# Patient Record
Sex: Female | Born: 1945
Health system: Southern US, Community
[De-identification: ages and names within clinical notes are randomized; demographics above are authoritative.]

---

## 1998-02-17 ENCOUNTER — Ambulatory Visit (HOSPITAL_COMMUNITY): Admission: RE | Admit: 1998-02-17 | Discharge: 1998-02-17 | Payer: Self-pay | Admitting: Obstetrics and Gynecology

## 1999-12-10 ENCOUNTER — Encounter: Payer: Self-pay | Admitting: Emergency Medicine

## 1999-12-10 ENCOUNTER — Emergency Department (HOSPITAL_COMMUNITY): Admission: EM | Admit: 1999-12-10 | Discharge: 1999-12-10 | Payer: Self-pay | Admitting: Emergency Medicine

## 2002-11-28 ENCOUNTER — Emergency Department (HOSPITAL_COMMUNITY): Admission: EM | Admit: 2002-11-28 | Discharge: 2002-11-28 | Payer: Self-pay | Admitting: Emergency Medicine

## 2002-11-28 ENCOUNTER — Encounter: Payer: Self-pay | Admitting: Emergency Medicine

## 2006-06-14 ENCOUNTER — Ambulatory Visit (HOSPITAL_COMMUNITY): Admission: RE | Admit: 2006-06-14 | Discharge: 2006-06-14 | Payer: Self-pay | Admitting: Obstetrics & Gynecology

## 2007-06-19 ENCOUNTER — Ambulatory Visit (HOSPITAL_COMMUNITY): Admission: RE | Admit: 2007-06-19 | Discharge: 2007-06-19 | Payer: Self-pay | Admitting: Obstetrics & Gynecology

## 2008-06-19 ENCOUNTER — Ambulatory Visit (HOSPITAL_COMMUNITY): Admission: RE | Admit: 2008-06-19 | Discharge: 2008-06-19 | Payer: Self-pay | Admitting: Obstetrics & Gynecology

## 2009-06-03 ENCOUNTER — Encounter: Admission: RE | Admit: 2009-06-03 | Discharge: 2009-06-03 | Payer: Self-pay | Admitting: Family Medicine

## 2009-06-25 ENCOUNTER — Ambulatory Visit (HOSPITAL_COMMUNITY): Admission: RE | Admit: 2009-06-25 | Discharge: 2009-06-25 | Payer: Self-pay | Admitting: Obstetrics & Gynecology

## 2010-06-26 ENCOUNTER — Ambulatory Visit (HOSPITAL_COMMUNITY): Admission: RE | Admit: 2010-06-26 | Discharge: 2010-06-26 | Payer: Self-pay | Admitting: Obstetrics & Gynecology

## 2010-12-18 ENCOUNTER — Other Ambulatory Visit (HOSPITAL_COMMUNITY): Payer: Self-pay | Admitting: Obstetrics & Gynecology

## 2010-12-18 DIAGNOSIS — M8580 Other specified disorders of bone density and structure, unspecified site: Secondary | ICD-10-CM

## 2010-12-28 ENCOUNTER — Ambulatory Visit (HOSPITAL_COMMUNITY): Payer: Medicare Other

## 2011-02-03 ENCOUNTER — Ambulatory Visit (HOSPITAL_COMMUNITY): Admission: RE | Admit: 2011-02-03 | Payer: Medicare Other | Source: Ambulatory Visit

## 2011-02-03 ENCOUNTER — Ambulatory Visit (HOSPITAL_COMMUNITY)
Admission: RE | Admit: 2011-02-03 | Discharge: 2011-02-03 | Disposition: A | Discharge status: Home / Self Care | Payer: Medicare Other | Source: Ambulatory Visit | Attending: Obstetrics & Gynecology | Admitting: Obstetrics & Gynecology

## 2011-02-03 DIAGNOSIS — Z780 Asymptomatic menopausal state: Secondary | ICD-10-CM | POA: Insufficient documentation

## 2011-02-03 DIAGNOSIS — Z13820 Encounter for screening for osteoporosis: Secondary | ICD-10-CM | POA: Insufficient documentation

## 2011-02-03 DIAGNOSIS — M8580 Other specified disorders of bone density and structure, unspecified site: Secondary | ICD-10-CM

## 2011-04-06 ENCOUNTER — Other Ambulatory Visit (HOSPITAL_COMMUNITY): Payer: Self-pay | Admitting: Obstetrics & Gynecology

## 2011-04-06 DIAGNOSIS — Z1231 Encounter for screening mammogram for malignant neoplasm of breast: Secondary | ICD-10-CM

## 2011-07-27 ENCOUNTER — Ambulatory Visit (HOSPITAL_COMMUNITY)
Admission: RE | Admit: 2011-07-27 | Discharge: 2011-07-27 | Disposition: A | Discharge status: Home / Self Care | Payer: Medicare Other | Source: Ambulatory Visit | Attending: Obstetrics & Gynecology | Admitting: Obstetrics & Gynecology

## 2011-07-27 DIAGNOSIS — Z1231 Encounter for screening mammogram for malignant neoplasm of breast: Secondary | ICD-10-CM | POA: Insufficient documentation

## 2012-05-09 ENCOUNTER — Other Ambulatory Visit (HOSPITAL_COMMUNITY): Payer: Self-pay | Admitting: Internal Medicine

## 2012-05-09 DIAGNOSIS — Z1231 Encounter for screening mammogram for malignant neoplasm of breast: Secondary | ICD-10-CM

## 2012-07-27 ENCOUNTER — Ambulatory Visit (HOSPITAL_COMMUNITY)
Admission: RE | Admit: 2012-07-27 | Discharge: 2012-07-27 | Disposition: A | Discharge status: Home / Self Care | Payer: Medicare Other | Source: Ambulatory Visit | Attending: Internal Medicine | Admitting: Internal Medicine

## 2012-07-27 DIAGNOSIS — Z1231 Encounter for screening mammogram for malignant neoplasm of breast: Secondary | ICD-10-CM

## 2013-06-07 ENCOUNTER — Other Ambulatory Visit (HOSPITAL_COMMUNITY): Payer: Self-pay | Admitting: Internal Medicine

## 2013-06-07 DIAGNOSIS — Z1231 Encounter for screening mammogram for malignant neoplasm of breast: Secondary | ICD-10-CM

## 2013-06-27 ENCOUNTER — Ambulatory Visit (HOSPITAL_COMMUNITY): Payer: Medicare Other

## 2013-07-30 ENCOUNTER — Ambulatory Visit (HOSPITAL_COMMUNITY)
Admission: RE | Admit: 2013-07-30 | Discharge: 2013-07-30 | Disposition: A | Discharge status: Home / Self Care | Payer: Medicare Other | Source: Ambulatory Visit | Attending: Internal Medicine | Admitting: Internal Medicine

## 2013-07-30 ENCOUNTER — Other Ambulatory Visit (HOSPITAL_COMMUNITY): Payer: Self-pay | Admitting: Internal Medicine

## 2013-07-30 DIAGNOSIS — Z1231 Encounter for screening mammogram for malignant neoplasm of breast: Secondary | ICD-10-CM

## 2014-07-12 ENCOUNTER — Other Ambulatory Visit (HOSPITAL_COMMUNITY): Payer: Self-pay | Admitting: Internal Medicine

## 2014-07-12 DIAGNOSIS — Z1231 Encounter for screening mammogram for malignant neoplasm of breast: Secondary | ICD-10-CM

## 2014-08-02 ENCOUNTER — Ambulatory Visit (HOSPITAL_COMMUNITY)
Admission: RE | Admit: 2014-08-02 | Discharge: 2014-08-02 | Disposition: A | Discharge status: Home / Self Care | Payer: Medicare Other | Source: Ambulatory Visit | Attending: Internal Medicine | Admitting: Internal Medicine

## 2014-08-02 DIAGNOSIS — Z1231 Encounter for screening mammogram for malignant neoplasm of breast: Secondary | ICD-10-CM | POA: Diagnosis present

## 2014-11-13 DIAGNOSIS — E782 Mixed hyperlipidemia: Secondary | ICD-10-CM | POA: Diagnosis not present

## 2014-11-13 DIAGNOSIS — R319 Hematuria, unspecified: Secondary | ICD-10-CM | POA: Diagnosis not present

## 2014-11-20 DIAGNOSIS — R319 Hematuria, unspecified: Secondary | ICD-10-CM | POA: Diagnosis not present

## 2014-11-20 DIAGNOSIS — E782 Mixed hyperlipidemia: Secondary | ICD-10-CM | POA: Diagnosis not present

## 2014-11-21 ENCOUNTER — Other Ambulatory Visit: Payer: Self-pay | Admitting: Internal Medicine

## 2014-11-21 DIAGNOSIS — R319 Hematuria, unspecified: Secondary | ICD-10-CM

## 2014-12-04 ENCOUNTER — Other Ambulatory Visit: Payer: Medicare Other

## 2014-12-04 ENCOUNTER — Ambulatory Visit
Admission: RE | Admit: 2014-12-04 | Discharge: 2014-12-04 | Disposition: A | Discharge status: Home / Self Care | Payer: Medicare Other | Source: Ambulatory Visit | Attending: Internal Medicine | Admitting: Internal Medicine

## 2014-12-04 DIAGNOSIS — N200 Calculus of kidney: Secondary | ICD-10-CM | POA: Diagnosis not present

## 2014-12-04 DIAGNOSIS — R319 Hematuria, unspecified: Secondary | ICD-10-CM

## 2014-12-04 DIAGNOSIS — N281 Cyst of kidney, acquired: Secondary | ICD-10-CM | POA: Diagnosis not present

## 2014-12-04 MED ORDER — IOHEXOL 300 MG/ML  SOLN
125.0000 mL | Freq: Once | INTRAMUSCULAR | Status: AC | PRN
  Administered 2014-12-04: 125 mL via INTRAVENOUS

## 2014-12-06 DIAGNOSIS — R319 Hematuria, unspecified: Secondary | ICD-10-CM | POA: Diagnosis not present

## 2014-12-06 DIAGNOSIS — E782 Mixed hyperlipidemia: Secondary | ICD-10-CM | POA: Diagnosis not present

## 2015-06-06 DIAGNOSIS — E782 Mixed hyperlipidemia: Secondary | ICD-10-CM | POA: Diagnosis not present

## 2015-06-10 DIAGNOSIS — Z780 Asymptomatic menopausal state: Secondary | ICD-10-CM | POA: Diagnosis not present

## 2015-06-10 DIAGNOSIS — M150 Primary generalized (osteo)arthritis: Secondary | ICD-10-CM | POA: Diagnosis not present

## 2015-06-13 DIAGNOSIS — M150 Primary generalized (osteo)arthritis: Secondary | ICD-10-CM | POA: Diagnosis not present

## 2015-06-13 DIAGNOSIS — Z23 Encounter for immunization: Secondary | ICD-10-CM | POA: Diagnosis not present

## 2015-06-13 DIAGNOSIS — E782 Mixed hyperlipidemia: Secondary | ICD-10-CM | POA: Diagnosis not present

## 2015-06-13 DIAGNOSIS — Z0000 Encounter for general adult medical examination without abnormal findings: Secondary | ICD-10-CM | POA: Diagnosis not present

## 2015-07-08 ENCOUNTER — Other Ambulatory Visit (HOSPITAL_COMMUNITY): Payer: Self-pay | Admitting: Internal Medicine

## 2015-07-08 DIAGNOSIS — Z1231 Encounter for screening mammogram for malignant neoplasm of breast: Secondary | ICD-10-CM

## 2015-08-05 ENCOUNTER — Ambulatory Visit
Admission: RE | Admit: 2015-08-05 | Discharge: 2015-08-05 | Disposition: A | Discharge status: Home / Self Care | Payer: Medicare Other | Source: Ambulatory Visit | Attending: Internal Medicine | Admitting: Internal Medicine

## 2015-08-05 DIAGNOSIS — Z1231 Encounter for screening mammogram for malignant neoplasm of breast: Secondary | ICD-10-CM

## 2015-09-15 DIAGNOSIS — R5383 Other fatigue: Secondary | ICD-10-CM | POA: Diagnosis not present

## 2015-12-11 DIAGNOSIS — E782 Mixed hyperlipidemia: Secondary | ICD-10-CM | POA: Diagnosis not present

## 2015-12-11 DIAGNOSIS — M150 Primary generalized (osteo)arthritis: Secondary | ICD-10-CM | POA: Diagnosis not present

## 2016-03-17 DIAGNOSIS — K219 Gastro-esophageal reflux disease without esophagitis: Secondary | ICD-10-CM | POA: Diagnosis not present

## 2016-06-23 DIAGNOSIS — Z0000 Encounter for general adult medical examination without abnormal findings: Secondary | ICD-10-CM | POA: Diagnosis not present

## 2016-06-23 DIAGNOSIS — E782 Mixed hyperlipidemia: Secondary | ICD-10-CM | POA: Diagnosis not present

## 2016-06-23 DIAGNOSIS — Z79899 Other long term (current) drug therapy: Secondary | ICD-10-CM | POA: Diagnosis not present

## 2016-06-23 DIAGNOSIS — R319 Hematuria, unspecified: Secondary | ICD-10-CM | POA: Diagnosis not present

## 2016-06-24 ENCOUNTER — Other Ambulatory Visit: Payer: Self-pay | Admitting: Internal Medicine

## 2016-06-24 DIAGNOSIS — Z1231 Encounter for screening mammogram for malignant neoplasm of breast: Secondary | ICD-10-CM

## 2016-06-30 DIAGNOSIS — Z780 Asymptomatic menopausal state: Secondary | ICD-10-CM | POA: Diagnosis not present

## 2016-06-30 DIAGNOSIS — Z23 Encounter for immunization: Secondary | ICD-10-CM | POA: Diagnosis not present

## 2016-06-30 DIAGNOSIS — M8580 Other specified disorders of bone density and structure, unspecified site: Secondary | ICD-10-CM | POA: Diagnosis not present

## 2016-06-30 DIAGNOSIS — R319 Hematuria, unspecified: Secondary | ICD-10-CM | POA: Diagnosis not present

## 2016-06-30 DIAGNOSIS — M150 Primary generalized (osteo)arthritis: Secondary | ICD-10-CM | POA: Diagnosis not present

## 2016-06-30 DIAGNOSIS — E782 Mixed hyperlipidemia: Secondary | ICD-10-CM | POA: Diagnosis not present

## 2016-07-01 DIAGNOSIS — M5136 Other intervertebral disc degeneration, lumbar region: Secondary | ICD-10-CM | POA: Diagnosis not present

## 2016-07-01 DIAGNOSIS — M5442 Lumbago with sciatica, left side: Secondary | ICD-10-CM | POA: Diagnosis not present

## 2016-08-05 ENCOUNTER — Ambulatory Visit
Admission: RE | Admit: 2016-08-05 | Discharge: 2016-08-05 | Disposition: A | Discharge status: Home / Self Care | Payer: Medicare Other | Source: Ambulatory Visit | Attending: Internal Medicine | Admitting: Internal Medicine

## 2016-08-05 DIAGNOSIS — Z1231 Encounter for screening mammogram for malignant neoplasm of breast: Secondary | ICD-10-CM

## 2016-12-30 DIAGNOSIS — K640 First degree hemorrhoids: Secondary | ICD-10-CM | POA: Diagnosis not present

## 2016-12-30 DIAGNOSIS — Z1211 Encounter for screening for malignant neoplasm of colon: Secondary | ICD-10-CM | POA: Diagnosis not present

## 2017-01-10 DIAGNOSIS — Z1212 Encounter for screening for malignant neoplasm of rectum: Secondary | ICD-10-CM | POA: Diagnosis not present

## 2017-01-10 DIAGNOSIS — Z1211 Encounter for screening for malignant neoplasm of colon: Secondary | ICD-10-CM | POA: Diagnosis not present

## 2017-03-24 DIAGNOSIS — B029 Zoster without complications: Secondary | ICD-10-CM | POA: Diagnosis not present

## 2017-05-26 DIAGNOSIS — S139XXA Sprain of joints and ligaments of unspecified parts of neck, initial encounter: Secondary | ICD-10-CM | POA: Diagnosis not present

## 2017-06-28 ENCOUNTER — Other Ambulatory Visit: Payer: Self-pay | Admitting: Internal Medicine

## 2017-06-28 DIAGNOSIS — Z1231 Encounter for screening mammogram for malignant neoplasm of breast: Secondary | ICD-10-CM

## 2017-07-06 DIAGNOSIS — Z0000 Encounter for general adult medical examination without abnormal findings: Secondary | ICD-10-CM | POA: Diagnosis not present

## 2017-07-06 DIAGNOSIS — E782 Mixed hyperlipidemia: Secondary | ICD-10-CM | POA: Diagnosis not present

## 2017-07-13 DIAGNOSIS — R002 Palpitations: Secondary | ICD-10-CM | POA: Diagnosis not present

## 2017-07-13 DIAGNOSIS — E782 Mixed hyperlipidemia: Secondary | ICD-10-CM | POA: Diagnosis not present

## 2017-07-13 DIAGNOSIS — Z23 Encounter for immunization: Secondary | ICD-10-CM | POA: Diagnosis not present

## 2017-07-13 DIAGNOSIS — R319 Hematuria, unspecified: Secondary | ICD-10-CM | POA: Diagnosis not present

## 2017-07-13 DIAGNOSIS — M150 Primary generalized (osteo)arthritis: Secondary | ICD-10-CM | POA: Diagnosis not present

## 2017-08-08 ENCOUNTER — Ambulatory Visit
Admission: RE | Admit: 2017-08-08 | Discharge: 2017-08-08 | Disposition: A | Discharge status: Home / Self Care | Payer: Medicare Other | Source: Ambulatory Visit | Attending: Internal Medicine | Admitting: Internal Medicine

## 2017-08-08 DIAGNOSIS — Z1231 Encounter for screening mammogram for malignant neoplasm of breast: Secondary | ICD-10-CM

## 2017-09-08 DIAGNOSIS — L509 Urticaria, unspecified: Secondary | ICD-10-CM | POA: Diagnosis not present

## 2017-09-28 DIAGNOSIS — L309 Dermatitis, unspecified: Secondary | ICD-10-CM | POA: Diagnosis not present

## 2018-02-22 DIAGNOSIS — M19042 Primary osteoarthritis, left hand: Secondary | ICD-10-CM | POA: Diagnosis not present

## 2018-02-22 DIAGNOSIS — M79645 Pain in left finger(s): Secondary | ICD-10-CM | POA: Diagnosis not present

## 2018-02-22 DIAGNOSIS — E782 Mixed hyperlipidemia: Secondary | ICD-10-CM | POA: Diagnosis not present

## 2018-02-22 DIAGNOSIS — M150 Primary generalized (osteo)arthritis: Secondary | ICD-10-CM | POA: Diagnosis not present

## 2018-06-19 DIAGNOSIS — Z0184 Encounter for antibody response examination: Secondary | ICD-10-CM | POA: Diagnosis not present

## 2018-07-04 ENCOUNTER — Other Ambulatory Visit: Payer: Self-pay | Admitting: Internal Medicine

## 2018-07-04 DIAGNOSIS — Z1231 Encounter for screening mammogram for malignant neoplasm of breast: Secondary | ICD-10-CM

## 2018-07-11 DIAGNOSIS — Z23 Encounter for immunization: Secondary | ICD-10-CM | POA: Diagnosis not present

## 2018-07-14 DIAGNOSIS — H01009 Unspecified blepharitis unspecified eye, unspecified eyelid: Secondary | ICD-10-CM | POA: Diagnosis not present

## 2018-07-14 DIAGNOSIS — Z23 Encounter for immunization: Secondary | ICD-10-CM | POA: Diagnosis not present

## 2018-07-14 DIAGNOSIS — E782 Mixed hyperlipidemia: Secondary | ICD-10-CM | POA: Diagnosis not present

## 2018-07-25 DIAGNOSIS — M150 Primary generalized (osteo)arthritis: Secondary | ICD-10-CM | POA: Diagnosis not present

## 2018-07-25 DIAGNOSIS — E782 Mixed hyperlipidemia: Secondary | ICD-10-CM | POA: Diagnosis not present

## 2018-08-01 DIAGNOSIS — H31001 Unspecified chorioretinal scars, right eye: Secondary | ICD-10-CM | POA: Diagnosis not present

## 2018-08-01 DIAGNOSIS — R319 Hematuria, unspecified: Secondary | ICD-10-CM | POA: Diagnosis not present

## 2018-08-01 DIAGNOSIS — E782 Mixed hyperlipidemia: Secondary | ICD-10-CM | POA: Diagnosis not present

## 2018-08-01 DIAGNOSIS — Z0000 Encounter for general adult medical examination without abnormal findings: Secondary | ICD-10-CM | POA: Diagnosis not present

## 2018-08-01 DIAGNOSIS — H353131 Nonexudative age-related macular degeneration, bilateral, early dry stage: Secondary | ICD-10-CM | POA: Diagnosis not present

## 2018-08-01 DIAGNOSIS — M150 Primary generalized (osteo)arthritis: Secondary | ICD-10-CM | POA: Diagnosis not present

## 2018-08-01 DIAGNOSIS — H35363 Drusen (degenerative) of macula, bilateral: Secondary | ICD-10-CM | POA: Diagnosis not present

## 2018-08-01 DIAGNOSIS — H2513 Age-related nuclear cataract, bilateral: Secondary | ICD-10-CM | POA: Diagnosis not present

## 2018-08-09 ENCOUNTER — Ambulatory Visit
Admission: RE | Admit: 2018-08-09 | Discharge: 2018-08-09 | Disposition: A | Discharge status: Home / Self Care | Payer: Medicare Other | Source: Ambulatory Visit | Attending: Internal Medicine | Admitting: Internal Medicine

## 2018-08-09 DIAGNOSIS — Z1231 Encounter for screening mammogram for malignant neoplasm of breast: Secondary | ICD-10-CM | POA: Diagnosis not present

## 2019-01-12 DIAGNOSIS — R6889 Other general symptoms and signs: Secondary | ICD-10-CM | POA: Diagnosis not present

## 2019-01-12 DIAGNOSIS — J069 Acute upper respiratory infection, unspecified: Secondary | ICD-10-CM | POA: Diagnosis not present

## 2019-06-19 ENCOUNTER — Other Ambulatory Visit: Payer: Self-pay | Admitting: Internal Medicine

## 2019-06-19 DIAGNOSIS — Z1231 Encounter for screening mammogram for malignant neoplasm of breast: Secondary | ICD-10-CM

## 2019-08-06 DIAGNOSIS — R319 Hematuria, unspecified: Secondary | ICD-10-CM | POA: Diagnosis not present

## 2019-08-06 DIAGNOSIS — Z0000 Encounter for general adult medical examination without abnormal findings: Secondary | ICD-10-CM | POA: Diagnosis not present

## 2019-08-06 DIAGNOSIS — M150 Primary generalized (osteo)arthritis: Secondary | ICD-10-CM | POA: Diagnosis not present

## 2019-08-06 DIAGNOSIS — E782 Mixed hyperlipidemia: Secondary | ICD-10-CM | POA: Diagnosis not present

## 2019-08-06 DIAGNOSIS — M810 Age-related osteoporosis without current pathological fracture: Secondary | ICD-10-CM | POA: Diagnosis not present

## 2019-08-10 DIAGNOSIS — R002 Palpitations: Secondary | ICD-10-CM | POA: Diagnosis not present

## 2019-08-10 DIAGNOSIS — M810 Age-related osteoporosis without current pathological fracture: Secondary | ICD-10-CM | POA: Diagnosis not present

## 2019-08-10 DIAGNOSIS — E782 Mixed hyperlipidemia: Secondary | ICD-10-CM | POA: Diagnosis not present

## 2019-08-10 DIAGNOSIS — Z0000 Encounter for general adult medical examination without abnormal findings: Secondary | ICD-10-CM | POA: Diagnosis not present

## 2019-08-10 DIAGNOSIS — M150 Primary generalized (osteo)arthritis: Secondary | ICD-10-CM | POA: Diagnosis not present

## 2019-08-13 ENCOUNTER — Other Ambulatory Visit: Payer: Self-pay

## 2019-08-13 ENCOUNTER — Ambulatory Visit
Admission: RE | Admit: 2019-08-13 | Discharge: 2019-08-13 | Disposition: A | Discharge status: Home / Self Care | Payer: Medicare Other | Source: Ambulatory Visit | Attending: Internal Medicine | Admitting: Internal Medicine

## 2019-08-13 DIAGNOSIS — Z1231 Encounter for screening mammogram for malignant neoplasm of breast: Secondary | ICD-10-CM

## 2019-09-19 DIAGNOSIS — M25561 Pain in right knee: Secondary | ICD-10-CM | POA: Diagnosis not present

## 2019-09-19 DIAGNOSIS — M25511 Pain in right shoulder: Secondary | ICD-10-CM | POA: Diagnosis not present

## 2019-09-26 DIAGNOSIS — M25562 Pain in left knee: Secondary | ICD-10-CM | POA: Diagnosis not present

## 2019-09-26 DIAGNOSIS — M1712 Unilateral primary osteoarthritis, left knee: Secondary | ICD-10-CM | POA: Diagnosis not present

## 2019-09-26 DIAGNOSIS — M1990 Unspecified osteoarthritis, unspecified site: Secondary | ICD-10-CM | POA: Diagnosis not present

## 2019-09-26 DIAGNOSIS — M25561 Pain in right knee: Secondary | ICD-10-CM | POA: Diagnosis not present

## 2019-09-26 DIAGNOSIS — M25511 Pain in right shoulder: Secondary | ICD-10-CM | POA: Diagnosis not present

## 2019-09-26 DIAGNOSIS — M7581 Other shoulder lesions, right shoulder: Secondary | ICD-10-CM | POA: Diagnosis not present

## 2019-09-26 DIAGNOSIS — M1711 Unilateral primary osteoarthritis, right knee: Secondary | ICD-10-CM | POA: Diagnosis not present

## 2020-03-12 DIAGNOSIS — S139XXA Sprain of joints and ligaments of unspecified parts of neck, initial encounter: Secondary | ICD-10-CM | POA: Diagnosis not present

## 2020-03-27 DIAGNOSIS — Z1211 Encounter for screening for malignant neoplasm of colon: Secondary | ICD-10-CM | POA: Diagnosis not present

## 2020-04-08 DIAGNOSIS — Z1212 Encounter for screening for malignant neoplasm of rectum: Secondary | ICD-10-CM | POA: Diagnosis not present

## 2020-04-08 DIAGNOSIS — Z1211 Encounter for screening for malignant neoplasm of colon: Secondary | ICD-10-CM | POA: Diagnosis not present

## 2020-07-11 ENCOUNTER — Other Ambulatory Visit: Payer: Self-pay | Admitting: Internal Medicine

## 2020-07-11 DIAGNOSIS — Z1231 Encounter for screening mammogram for malignant neoplasm of breast: Secondary | ICD-10-CM

## 2020-08-13 ENCOUNTER — Ambulatory Visit
Admission: RE | Admit: 2020-08-13 | Discharge: 2020-08-13 | Disposition: A | Discharge status: Home / Self Care | Payer: Medicare Other | Source: Ambulatory Visit | Attending: Internal Medicine | Admitting: Internal Medicine

## 2020-08-13 ENCOUNTER — Other Ambulatory Visit: Payer: Self-pay

## 2020-08-13 DIAGNOSIS — Z1231 Encounter for screening mammogram for malignant neoplasm of breast: Secondary | ICD-10-CM | POA: Diagnosis not present

## 2020-08-22 DIAGNOSIS — M810 Age-related osteoporosis without current pathological fracture: Secondary | ICD-10-CM | POA: Diagnosis not present

## 2020-08-22 DIAGNOSIS — Z0000 Encounter for general adult medical examination without abnormal findings: Secondary | ICD-10-CM | POA: Diagnosis not present

## 2020-08-22 DIAGNOSIS — R7303 Prediabetes: Secondary | ICD-10-CM | POA: Diagnosis not present

## 2020-08-22 DIAGNOSIS — R002 Palpitations: Secondary | ICD-10-CM | POA: Diagnosis not present

## 2020-08-22 DIAGNOSIS — M150 Primary generalized (osteo)arthritis: Secondary | ICD-10-CM | POA: Diagnosis not present

## 2020-08-22 DIAGNOSIS — E782 Mixed hyperlipidemia: Secondary | ICD-10-CM | POA: Diagnosis not present

## 2020-08-22 DIAGNOSIS — Z23 Encounter for immunization: Secondary | ICD-10-CM | POA: Diagnosis not present

## 2020-08-29 DIAGNOSIS — E782 Mixed hyperlipidemia: Secondary | ICD-10-CM | POA: Diagnosis not present

## 2020-08-29 DIAGNOSIS — Z0000 Encounter for general adult medical examination without abnormal findings: Secondary | ICD-10-CM | POA: Diagnosis not present

## 2020-08-29 DIAGNOSIS — M810 Age-related osteoporosis without current pathological fracture: Secondary | ICD-10-CM | POA: Diagnosis not present

## 2020-08-29 DIAGNOSIS — M1990 Unspecified osteoarthritis, unspecified site: Secondary | ICD-10-CM | POA: Diagnosis not present

## 2021-05-28 ENCOUNTER — Ambulatory Visit (INDEPENDENT_AMBULATORY_CARE_PROVIDER_SITE_OTHER): Payer: Medicare Other | Admitting: Family Medicine

## 2021-05-28 ENCOUNTER — Encounter: Payer: Self-pay | Admitting: Family Medicine

## 2021-05-28 ENCOUNTER — Other Ambulatory Visit: Payer: Self-pay

## 2021-05-28 DIAGNOSIS — M25512 Pain in left shoulder: Secondary | ICD-10-CM

## 2021-05-28 MED ORDER — BACLOFEN 10 MG PO TABS: 5.0000 mg | ORAL_TABLET | Freq: Three times a day (TID) | ORAL | 3 refills | Status: AC | PRN

## 2021-05-28 NOTE — Progress Notes (Signed)
   Office Visit Note   Patient: Breanna Nicholson           Date of Birth: 1946/05/03           MRN: [id number] Visit Date: 05/28/2021 Requested by: Georgianne Fick, MD [address] [place],  [place] [address] PCP: Georgianne Fick, MD  Subjective: Chief Complaint  Patient presents with   Left Shoulder - Pain    Pain in the left shoulder since 05/25/21. Recalls no injury - just woke up with this pain in the trapezius region.     HPI: She is here with left posterior shoulder pain.  Interpreter was present today.  2 days ago she woke up with pain, no injury.  Pain does not radiate down the arm.  No neck pain associated with this.  Is getting a little bit better but not gone completely.  She is right-hand dominant.  She is a very active lady, she walks 30 minutes a day for exercise.                ROS:   All other systems were reviewed and are negative.  Objective: Vital Signs: There were no vitals taken for this visit.  Physical Exam:  General:  Alert and oriented, in no acute distress. Pulm:  Breathing unlabored. Psy:  Normal mood, congruent affect. Skin: No rash Left shoulder: She has good neck range of motion, negative Spurling's test.  Tender trigger point in the left trapezius belly and rhomboid area.  Palpation here seems to reproduce her pain.  Upper extremity strength and reflexes are normal.    Imaging: No results found.  Assessment & Plan: Left shoulder pain, suspect myofascial -She will ask her son to do massage.  Baclofen as needed.  Physical therapy referral if symptoms persist.     Procedures: No procedures performed        PMFS History: There are no problems to display for this patient.  History reviewed. No pertinent past medical history.  Family History  Problem Relation Age of Onset   Breast cancer Sister     History reviewed. No pertinent surgical history. Social History   Occupational History   Not on file  Tobacco Use    Smoking status: Not on file   Smokeless tobacco: Not on file  Substance and Sexual Activity   Alcohol use: Not on file   Drug use: Not on file   Sexual activity: Not on file

## 2021-07-08 ENCOUNTER — Other Ambulatory Visit: Payer: Self-pay | Admitting: Internal Medicine

## 2021-07-08 DIAGNOSIS — Z1231 Encounter for screening mammogram for malignant neoplasm of breast: Secondary | ICD-10-CM

## 2021-07-28 DIAGNOSIS — Z23 Encounter for immunization: Secondary | ICD-10-CM | POA: Diagnosis not present

## 2021-07-28 DIAGNOSIS — H1045 Other chronic allergic conjunctivitis: Secondary | ICD-10-CM | POA: Diagnosis not present

## 2021-08-14 ENCOUNTER — Ambulatory Visit
Admission: RE | Admit: 2021-08-14 | Discharge: 2021-08-14 | Disposition: A | Discharge status: Home / Self Care | Payer: Medicare Other | Source: Ambulatory Visit | Attending: Internal Medicine | Admitting: Internal Medicine

## 2021-08-14 ENCOUNTER — Other Ambulatory Visit: Payer: Self-pay

## 2021-08-14 DIAGNOSIS — Z1231 Encounter for screening mammogram for malignant neoplasm of breast: Secondary | ICD-10-CM | POA: Diagnosis not present

## 2021-09-04 DIAGNOSIS — E782 Mixed hyperlipidemia: Secondary | ICD-10-CM | POA: Diagnosis not present

## 2021-09-04 DIAGNOSIS — R5383 Other fatigue: Secondary | ICD-10-CM | POA: Diagnosis not present

## 2021-09-04 DIAGNOSIS — Z0000 Encounter for general adult medical examination without abnormal findings: Secondary | ICD-10-CM | POA: Diagnosis not present

## 2021-09-04 DIAGNOSIS — E559 Vitamin D deficiency, unspecified: Secondary | ICD-10-CM | POA: Diagnosis not present

## 2021-09-11 DIAGNOSIS — Z0000 Encounter for general adult medical examination without abnormal findings: Secondary | ICD-10-CM | POA: Diagnosis not present

## 2021-09-11 DIAGNOSIS — M810 Age-related osteoporosis without current pathological fracture: Secondary | ICD-10-CM | POA: Diagnosis not present

## 2021-09-11 DIAGNOSIS — M1990 Unspecified osteoarthritis, unspecified site: Secondary | ICD-10-CM | POA: Diagnosis not present

## 2021-09-11 DIAGNOSIS — E559 Vitamin D deficiency, unspecified: Secondary | ICD-10-CM | POA: Diagnosis not present

## 2021-09-11 DIAGNOSIS — E782 Mixed hyperlipidemia: Secondary | ICD-10-CM | POA: Diagnosis not present

## 2021-09-23 IMAGING — MG DIGITAL SCREENING BILAT W/ TOMO W/ CAD
8 series · 9 of 24 positions shown · non-contrast
Comparison: Previous exam(s).

CLINICAL DATA: Screening.

EXAM:
DIGITAL SCREENING BILATERAL MAMMOGRAM WITH TOMO AND CAD

[R MLO synth-2D]
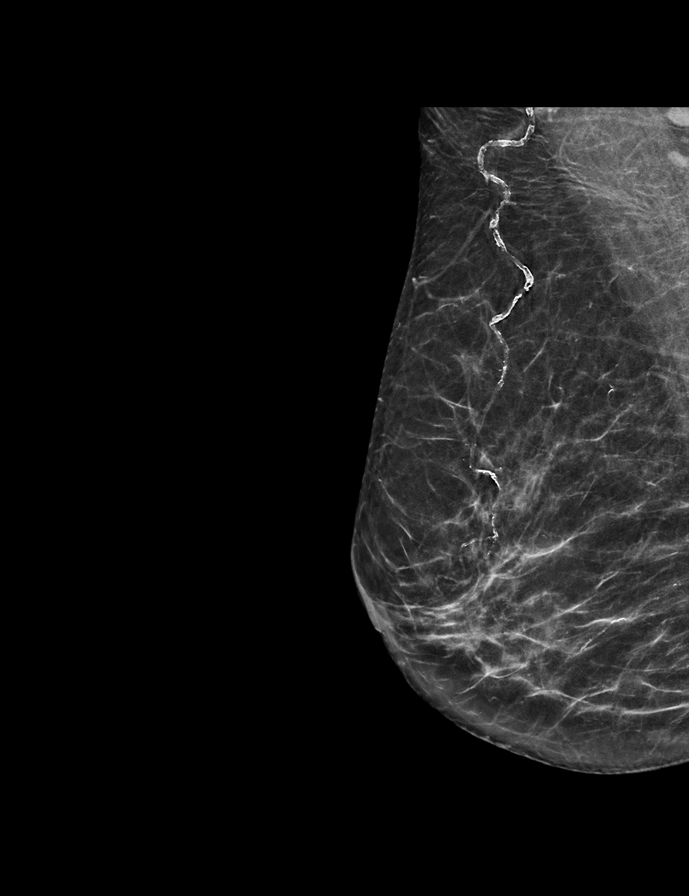

[R CC synth-2D]
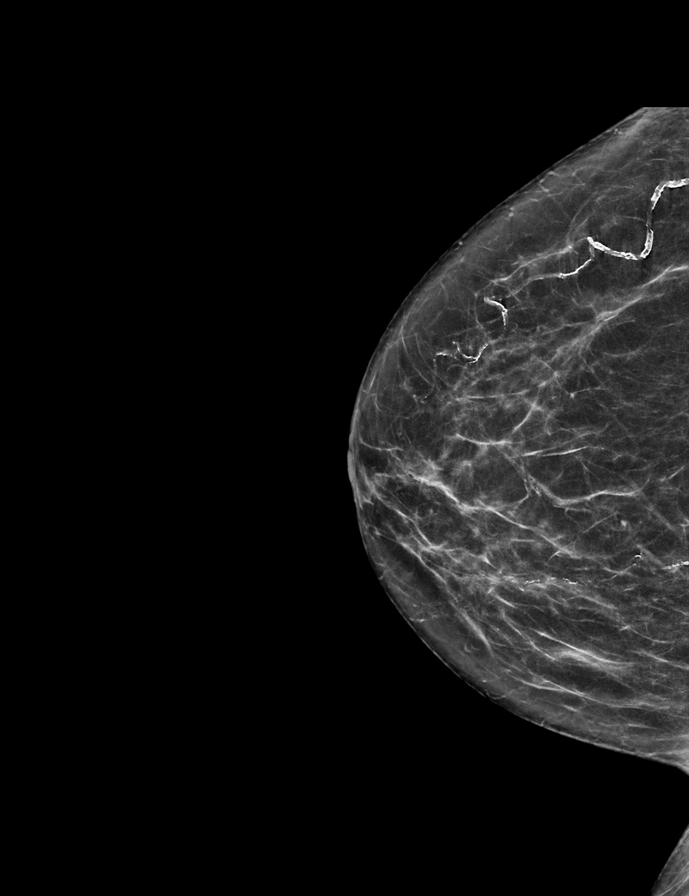

[L CC synth-2D]
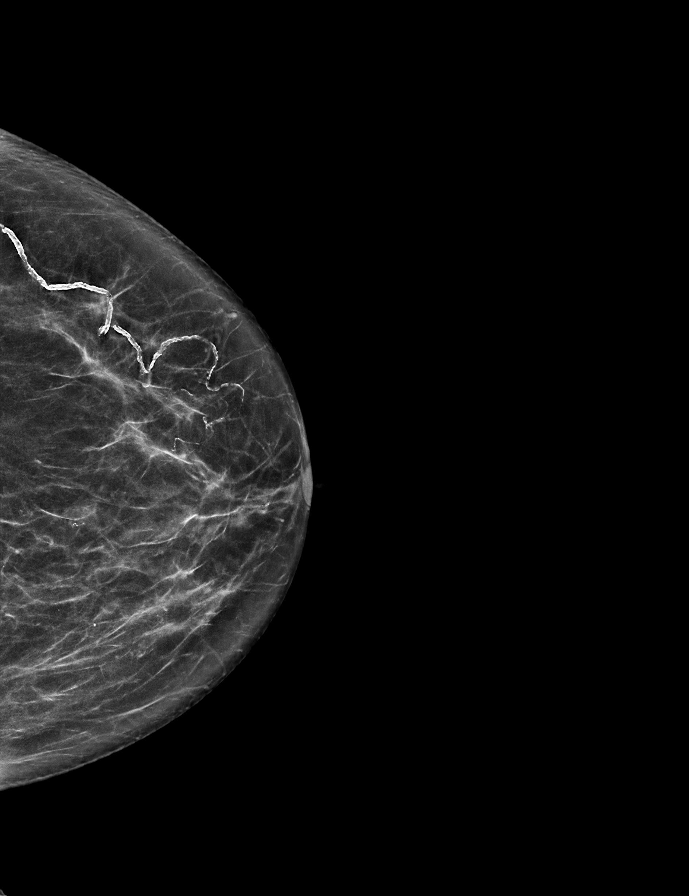

[L MLO synth-2D]
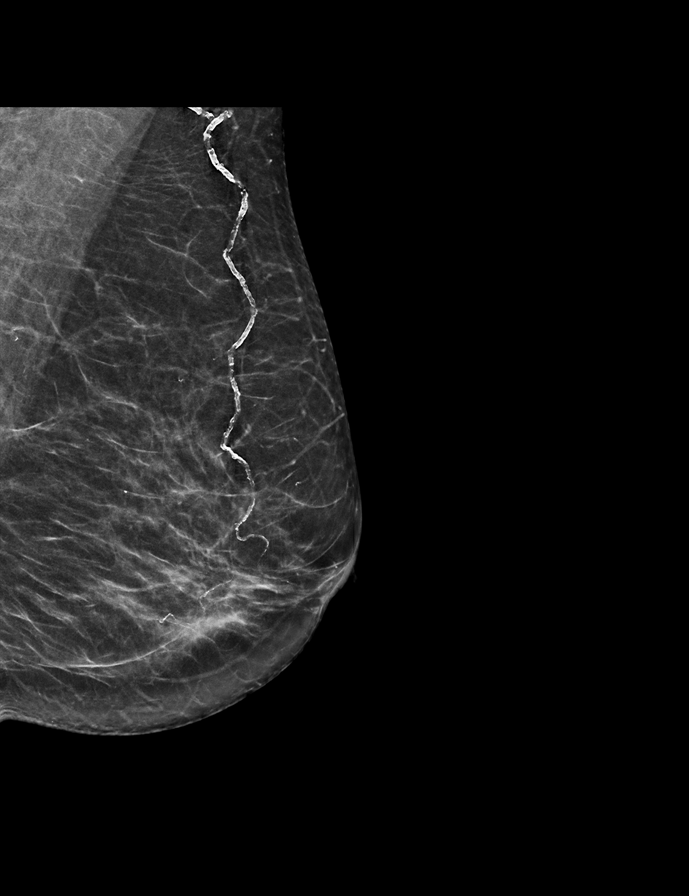

[R MLO tomo · 2 of 56 frames shown]
[frame 19/56]
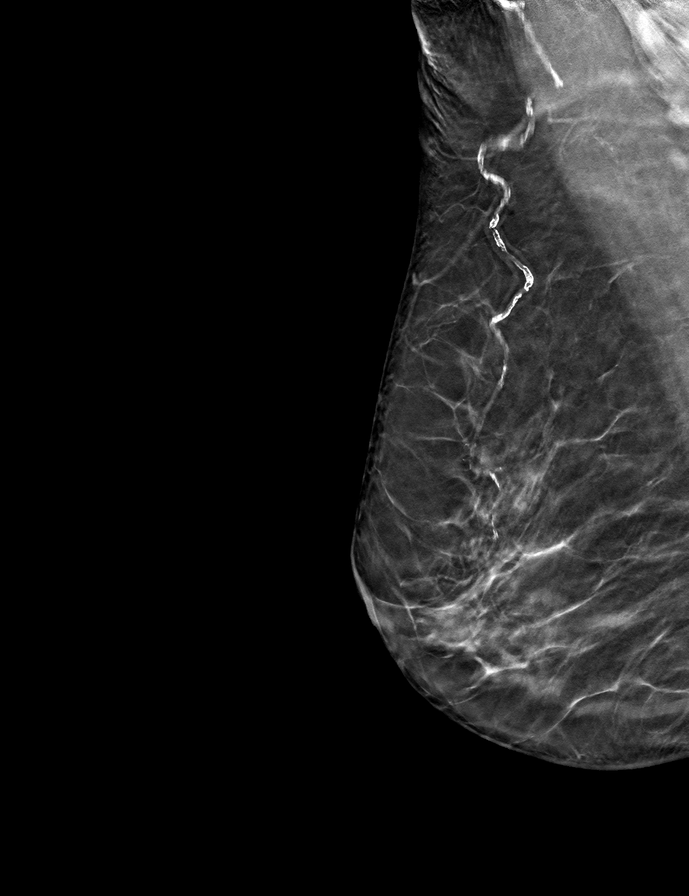
[frame 29/56]
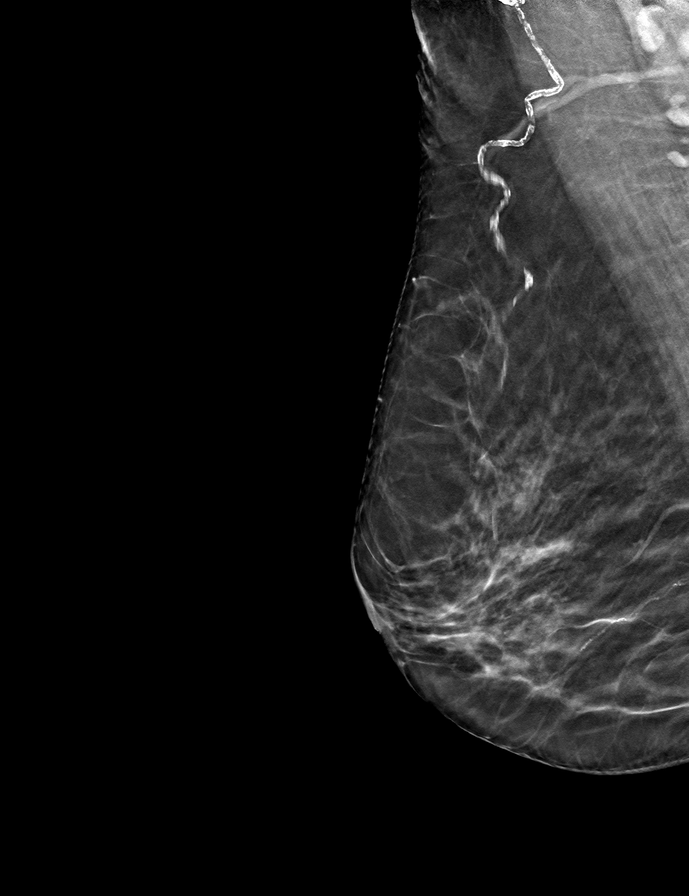

[L MLO tomo · tomo slice 29/56.0]
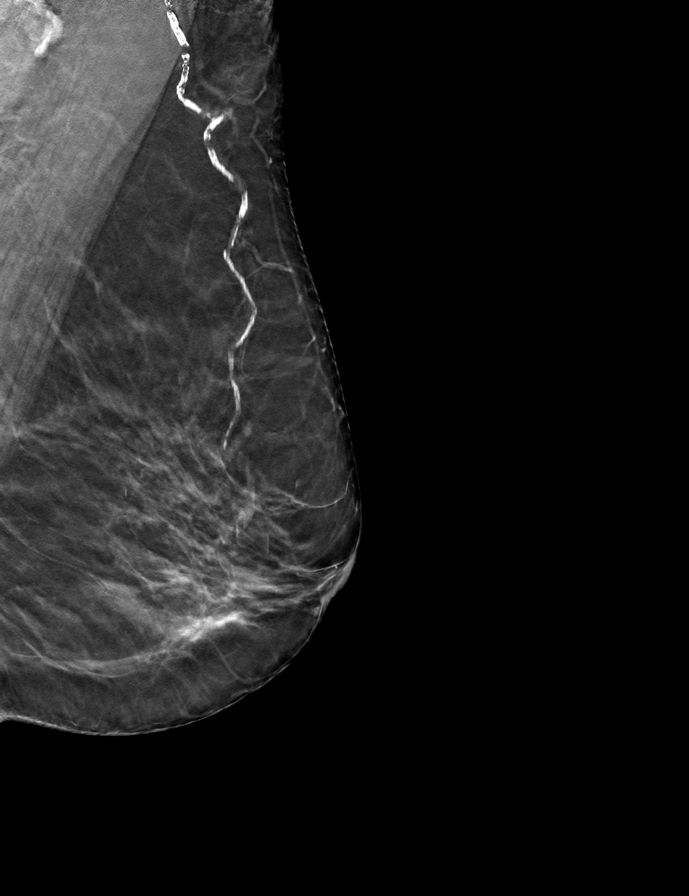

[L CC tomo · tomo slice 27/53.0]
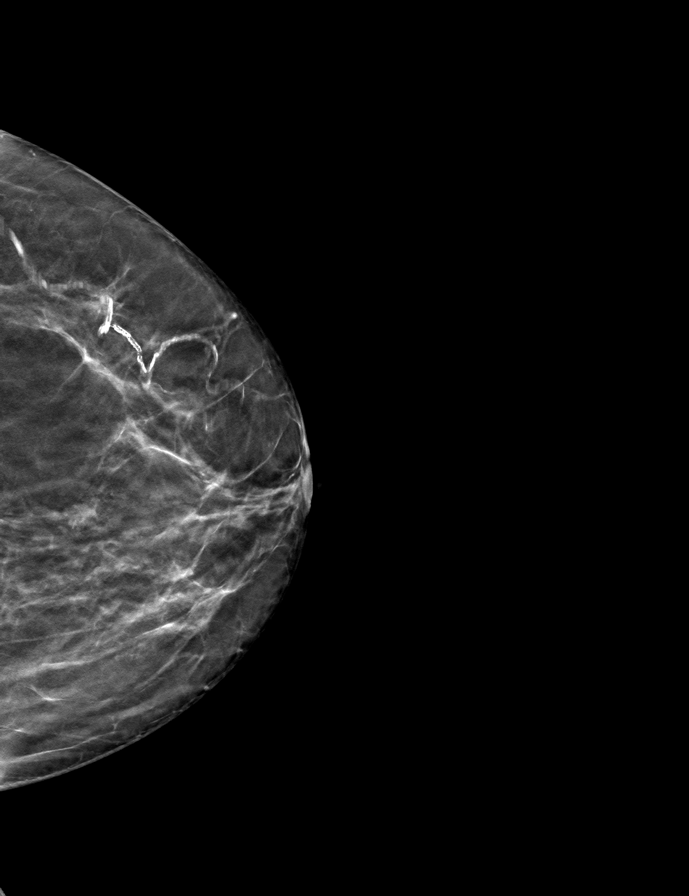

[R CC tomo · tomo slice 29/56.0]
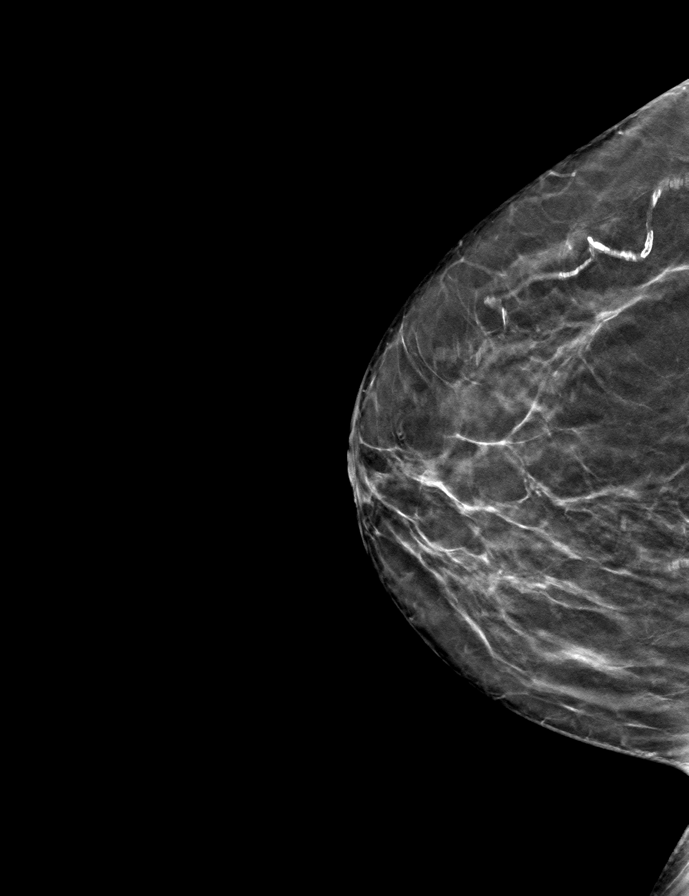

[9 of 24 positions shown; findings below may reference images not displayed]

ACR Breast Density Category b: There are scattered areas of
fibroglandular density.
FINDINGS: There are no findings suspicious for malignancy. Images were
processed with CAD.
IMPRESSION: No mammographic evidence of malignancy. A result letter of this
screening mammogram will be mailed directly to the patient.

RECOMMENDATION:
Screening mammogram in one year. (Code:CN-[id number])

BI-RADS CATEGORY  1: Negative.

## 2022-07-13 ENCOUNTER — Other Ambulatory Visit: Payer: Self-pay | Admitting: Internal Medicine

## 2022-07-13 DIAGNOSIS — Z1231 Encounter for screening mammogram for malignant neoplasm of breast: Secondary | ICD-10-CM

## 2022-08-16 ENCOUNTER — Ambulatory Visit
Admission: RE | Admit: 2022-08-16 | Discharge: 2022-08-16 | Disposition: A | Discharge status: Home / Self Care | Payer: Medicare Other | Source: Ambulatory Visit | Attending: Internal Medicine | Admitting: Internal Medicine

## 2022-08-16 DIAGNOSIS — Z1231 Encounter for screening mammogram for malignant neoplasm of breast: Secondary | ICD-10-CM

## 2022-09-10 DIAGNOSIS — M810 Age-related osteoporosis without current pathological fracture: Secondary | ICD-10-CM | POA: Diagnosis not present

## 2022-09-10 DIAGNOSIS — E559 Vitamin D deficiency, unspecified: Secondary | ICD-10-CM | POA: Diagnosis not present

## 2022-09-10 DIAGNOSIS — R5383 Other fatigue: Secondary | ICD-10-CM | POA: Diagnosis not present

## 2022-09-10 DIAGNOSIS — Z0000 Encounter for general adult medical examination without abnormal findings: Secondary | ICD-10-CM | POA: Diagnosis not present

## 2022-09-10 DIAGNOSIS — Z23 Encounter for immunization: Secondary | ICD-10-CM | POA: Diagnosis not present

## 2022-09-10 DIAGNOSIS — E782 Mixed hyperlipidemia: Secondary | ICD-10-CM | POA: Diagnosis not present

## 2022-09-17 DIAGNOSIS — Z0000 Encounter for general adult medical examination without abnormal findings: Secondary | ICD-10-CM | POA: Diagnosis not present

## 2022-09-17 DIAGNOSIS — M1990 Unspecified osteoarthritis, unspecified site: Secondary | ICD-10-CM | POA: Diagnosis not present

## 2022-09-17 DIAGNOSIS — M810 Age-related osteoporosis without current pathological fracture: Secondary | ICD-10-CM | POA: Diagnosis not present

## 2022-09-17 DIAGNOSIS — E782 Mixed hyperlipidemia: Secondary | ICD-10-CM | POA: Diagnosis not present

## 2022-09-17 DIAGNOSIS — E559 Vitamin D deficiency, unspecified: Secondary | ICD-10-CM | POA: Diagnosis not present

## 2023-03-07 DIAGNOSIS — H25013 Cortical age-related cataract, bilateral: Secondary | ICD-10-CM | POA: Diagnosis not present

## 2023-03-07 DIAGNOSIS — H04123 Dry eye syndrome of bilateral lacrimal glands: Secondary | ICD-10-CM | POA: Diagnosis not present

## 2023-03-07 DIAGNOSIS — H2513 Age-related nuclear cataract, bilateral: Secondary | ICD-10-CM | POA: Diagnosis not present

## 2023-03-07 DIAGNOSIS — H353131 Nonexudative age-related macular degeneration, bilateral, early dry stage: Secondary | ICD-10-CM | POA: Diagnosis not present

## 2023-04-01 DIAGNOSIS — H25813 Combined forms of age-related cataract, bilateral: Secondary | ICD-10-CM | POA: Diagnosis not present

## 2023-05-26 DIAGNOSIS — H524 Presbyopia: Secondary | ICD-10-CM | POA: Diagnosis not present

## 2023-05-26 DIAGNOSIS — H25812 Combined forms of age-related cataract, left eye: Secondary | ICD-10-CM | POA: Diagnosis not present

## 2023-05-26 DIAGNOSIS — H25813 Combined forms of age-related cataract, bilateral: Secondary | ICD-10-CM | POA: Diagnosis not present

## 2023-07-26 DIAGNOSIS — H268 Other specified cataract: Secondary | ICD-10-CM | POA: Diagnosis not present

## 2023-07-26 DIAGNOSIS — H25812 Combined forms of age-related cataract, left eye: Secondary | ICD-10-CM | POA: Diagnosis not present

## 2023-08-18 DIAGNOSIS — H25811 Combined forms of age-related cataract, right eye: Secondary | ICD-10-CM | POA: Diagnosis not present

## 2023-08-23 DIAGNOSIS — H268 Other specified cataract: Secondary | ICD-10-CM | POA: Diagnosis not present

## 2023-08-23 DIAGNOSIS — H2511 Age-related nuclear cataract, right eye: Secondary | ICD-10-CM | POA: Diagnosis not present

## 2023-08-23 DIAGNOSIS — H25811 Combined forms of age-related cataract, right eye: Secondary | ICD-10-CM | POA: Diagnosis not present

## 2023-08-30 ENCOUNTER — Other Ambulatory Visit: Payer: Self-pay | Admitting: Internal Medicine

## 2023-08-30 DIAGNOSIS — Z1231 Encounter for screening mammogram for malignant neoplasm of breast: Secondary | ICD-10-CM

## 2023-09-02 ENCOUNTER — Ambulatory Visit
Admission: RE | Admit: 2023-09-02 | Discharge: 2023-09-02 | Disposition: A | Discharge status: Home / Self Care | Payer: Medicare Other | Source: Ambulatory Visit | Attending: Internal Medicine | Admitting: Internal Medicine

## 2023-09-02 DIAGNOSIS — Z1231 Encounter for screening mammogram for malignant neoplasm of breast: Secondary | ICD-10-CM | POA: Diagnosis not present

## 2023-09-16 DIAGNOSIS — Z0000 Encounter for general adult medical examination without abnormal findings: Secondary | ICD-10-CM | POA: Diagnosis not present

## 2023-09-16 DIAGNOSIS — Z23 Encounter for immunization: Secondary | ICD-10-CM | POA: Diagnosis not present

## 2023-09-16 DIAGNOSIS — E782 Mixed hyperlipidemia: Secondary | ICD-10-CM | POA: Diagnosis not present

## 2023-09-16 DIAGNOSIS — M810 Age-related osteoporosis without current pathological fracture: Secondary | ICD-10-CM | POA: Diagnosis not present

## 2023-09-16 DIAGNOSIS — M1990 Unspecified osteoarthritis, unspecified site: Secondary | ICD-10-CM | POA: Diagnosis not present

## 2023-09-16 DIAGNOSIS — E559 Vitamin D deficiency, unspecified: Secondary | ICD-10-CM | POA: Diagnosis not present

## 2023-10-03 DIAGNOSIS — N182 Chronic kidney disease, stage 2 (mild): Secondary | ICD-10-CM | POA: Diagnosis not present

## 2023-10-03 DIAGNOSIS — Z0000 Encounter for general adult medical examination without abnormal findings: Secondary | ICD-10-CM | POA: Diagnosis not present

## 2023-10-03 DIAGNOSIS — E782 Mixed hyperlipidemia: Secondary | ICD-10-CM | POA: Diagnosis not present

## 2023-10-03 DIAGNOSIS — M1990 Unspecified osteoarthritis, unspecified site: Secondary | ICD-10-CM | POA: Diagnosis not present

## 2023-10-03 DIAGNOSIS — E559 Vitamin D deficiency, unspecified: Secondary | ICD-10-CM | POA: Diagnosis not present

## 2023-10-03 DIAGNOSIS — M810 Age-related osteoporosis without current pathological fracture: Secondary | ICD-10-CM | POA: Diagnosis not present

## 2023-11-09 DIAGNOSIS — H02881 Meibomian gland dysfunction right upper eyelid: Secondary | ICD-10-CM | POA: Diagnosis not present

## 2023-11-09 DIAGNOSIS — H04123 Dry eye syndrome of bilateral lacrimal glands: Secondary | ICD-10-CM | POA: Diagnosis not present

## 2023-11-29 DIAGNOSIS — H04123 Dry eye syndrome of bilateral lacrimal glands: Secondary | ICD-10-CM | POA: Diagnosis not present

## 2024-03-19 DIAGNOSIS — H04123 Dry eye syndrome of bilateral lacrimal glands: Secondary | ICD-10-CM | POA: Diagnosis not present

## 2024-03-19 DIAGNOSIS — H02881 Meibomian gland dysfunction right upper eyelid: Secondary | ICD-10-CM | POA: Diagnosis not present

## 2024-03-19 DIAGNOSIS — H353131 Nonexudative age-related macular degeneration, bilateral, early dry stage: Secondary | ICD-10-CM | POA: Diagnosis not present

## 2024-03-19 DIAGNOSIS — H02831 Dermatochalasis of right upper eyelid: Secondary | ICD-10-CM | POA: Diagnosis not present

## 2024-07-20 ENCOUNTER — Other Ambulatory Visit: Payer: Self-pay | Admitting: Internal Medicine

## 2024-07-20 DIAGNOSIS — Z1231 Encounter for screening mammogram for malignant neoplasm of breast: Secondary | ICD-10-CM

## 2024-07-23 DIAGNOSIS — M62838 Other muscle spasm: Secondary | ICD-10-CM | POA: Diagnosis not present

## 2024-07-31 DIAGNOSIS — H04123 Dry eye syndrome of bilateral lacrimal glands: Secondary | ICD-10-CM | POA: Diagnosis not present

## 2024-08-02 DIAGNOSIS — M25511 Pain in right shoulder: Secondary | ICD-10-CM | POA: Diagnosis not present

## 2024-09-03 ENCOUNTER — Ambulatory Visit

## 2024-09-11 ENCOUNTER — Ambulatory Visit
Admission: RE | Admit: 2024-09-11 | Discharge: 2024-09-11 | Disposition: A | Source: Ambulatory Visit | Attending: Internal Medicine | Admitting: Internal Medicine

## 2024-09-11 DIAGNOSIS — Z1231 Encounter for screening mammogram for malignant neoplasm of breast: Secondary | ICD-10-CM
